# Patient Record
Sex: Male | Born: 1945 | Race: White | Hispanic: No | Marital: Married | Smoking: Former smoker
Health system: Southern US, Community
[De-identification: ages and names within clinical notes are randomized; demographics above are authoritative.]

## PROBLEM LIST (undated history)

## (undated) DIAGNOSIS — C801 Malignant (primary) neoplasm, unspecified: Secondary | ICD-10-CM

## (undated) DIAGNOSIS — E785 Hyperlipidemia, unspecified: Secondary | ICD-10-CM

## (undated) DIAGNOSIS — I10 Essential (primary) hypertension: Secondary | ICD-10-CM

## (undated) HISTORY — PX: BACK SURGERY: SHX140

## (undated) HISTORY — PX: KNEE SURGERY: SHX244

## (undated) HISTORY — PX: NECK SURGERY: SHX720

## (undated) HISTORY — PX: REPLACEMENT TOTAL KNEE: SUR1224

## (undated) HISTORY — PX: THYROIDECTOMY: SHX17

## (undated) HISTORY — PX: KIDNEY SURGERY: SHX687

## (undated) HISTORY — PX: TMJ ARTHROPLASTY: SHX1066

---

## 1998-04-20 ENCOUNTER — Encounter: Admission: RE | Admit: 1998-04-20 | Discharge: 1998-04-20 | Payer: Self-pay | Admitting: *Deleted

## 1998-08-07 ENCOUNTER — Encounter: Payer: Self-pay | Admitting: Neurosurgery

## 1998-08-09 ENCOUNTER — Inpatient Hospital Stay (HOSPITAL_COMMUNITY): Admission: RE | Admit: 1998-08-09 | Discharge: 1998-08-16 | Payer: Self-pay | Admitting: Neurosurgery

## 1998-08-09 ENCOUNTER — Encounter: Payer: Self-pay | Admitting: Neurosurgery

## 1998-08-13 ENCOUNTER — Encounter: Payer: Self-pay | Admitting: Neurosurgery

## 2005-11-15 ENCOUNTER — Inpatient Hospital Stay (HOSPITAL_COMMUNITY): Admission: EM | Admit: 2005-11-15 | Discharge: 2005-11-17 | Payer: Self-pay | Admitting: Emergency Medicine

## 2015-07-10 ENCOUNTER — Emergency Department (INDEPENDENT_AMBULATORY_CARE_PROVIDER_SITE_OTHER)
Admission: EM | Admit: 2015-07-10 | Discharge: 2015-07-10 | Disposition: A | Discharge status: Home / Self Care | Payer: Medicare Other | Source: Home / Self Care | Attending: Family Medicine | Admitting: Family Medicine

## 2015-07-10 ENCOUNTER — Encounter: Payer: Self-pay | Admitting: *Deleted

## 2015-07-10 ENCOUNTER — Emergency Department (INDEPENDENT_AMBULATORY_CARE_PROVIDER_SITE_OTHER): Payer: Medicare Other

## 2015-07-10 DIAGNOSIS — W010XXA Fall on same level from slipping, tripping and stumbling without subsequent striking against object, initial encounter: Secondary | ICD-10-CM

## 2015-07-10 DIAGNOSIS — R0781 Pleurodynia: Secondary | ICD-10-CM | POA: Diagnosis not present

## 2015-07-10 DIAGNOSIS — R0789 Other chest pain: Secondary | ICD-10-CM

## 2015-07-10 DIAGNOSIS — W1839XA Other fall on same level, initial encounter: Secondary | ICD-10-CM | POA: Diagnosis not present

## 2015-07-10 DIAGNOSIS — R079 Chest pain, unspecified: Secondary | ICD-10-CM

## 2015-07-10 HISTORY — DX: Hyperlipidemia, unspecified: E78.5

## 2015-07-10 HISTORY — DX: Essential (primary) hypertension: I10

## 2015-07-10 HISTORY — DX: Malignant (primary) neoplasm, unspecified: C80.1

## 2015-07-10 NOTE — Discharge Instructions (Signed)
Call to schedule appointment with your primary care provider for recheck of symptoms in 1-2 weeks if not improving, sooner if worsening including fever or productive cough.  You may continue to take your pain medications as prescribed. You may also find relief with alternating cool and warm compresses such as and ice pain and heating pad to your Right side.  You may also find some relief of pain by using a folded up jacket or small pillow, hold against your side when you need to cough or sneeze. See below for further instructions.

## 2015-07-10 NOTE — ED Provider Notes (Signed)
CSN: [id number]     Arrival date & time 07/10/15  1304 History   First MD Initiated Contact with Patient 07/10/15 1317     Chief Complaint  Patient presents with  . Rib Injury   (Consider location/radiation/quality/duration/timing/severity/associated sxs/prior Treatment) HPI  Pt is a 69yo male presenting to [facility] with of Right side chest wall pain after a trip and fall 3 days ago.  Pt also c/o Right shoulder and neck pain but reports several surgeries to his neck over the years.  Pt most concerned for Right sided chest pain. Pain is sharp with movement and deep breaths, worse with palpation.  Pain is 3/10 at this time.  Due to chronic back pain, pt is on dilaudid at home which has provided mild to moderate relief. He also reports mild intermittent non-productive cough but notes he had the cough before the fall, likely due to a BP medication he was one. He was switched to a different BP medication less than 1 week ago. Denies fever, chills, n/v/d. Denies hitting his head. He is not on blood thinners.  Pt states he goes to pain management and does not need any additional pain medication but wants to make sure he did not fracture a rib.  Past Medical History  Diagnosis Date  . Hypertension   . Hyperlipidemia   . Cancer     thyroid   Past Surgical History  Procedure Laterality Date  . Back surgery    . Neck surgery    . Replacement total knee Left   . Knee surgery Right   . Tmj arthroplasty    . Thyroidectomy    . Kidney surgery Right     partial removal   History reviewed. No pertinent family history. Social History  Substance Use Topics  . Smoking status: Former Research scientist (life sciences)  . Smokeless tobacco: None  . Alcohol Use: No    Review of Systems  Constitutional: Negative for fever and chills.  HENT: Negative for congestion, ear pain, sore throat, trouble swallowing and voice change.   Respiratory: Positive for cough. Negative for shortness of breath.   Cardiovascular: Positive for chest  pain ( Right side). Negative for palpitations.  Gastrointestinal: Negative for nausea, vomiting, abdominal pain and diarrhea.  Musculoskeletal: Positive for arthralgias ( Right shoulder, chronic) and neck pain ( chronic). Negative for myalgias and back pain.  Skin: Negative for rash.  All other systems reviewed and are negative.   Allergies  Review of patient's allergies indicates no known allergies.  Home Medications   Prior to Admission medications   Medication Sig Start Date End Date Taking? Authorizing Provider  buPROPion (WELLBUTRIN) 100 MG tablet Take 100 mg by mouth 2 (two) times daily.   Yes Historical Provider, MD  HYDROmorphone (DILAUDID) 8 MG tablet Take 8 mg by mouth every 4 (four) hours as needed for severe pain.   Yes Historical Provider, MD  methocarbamol (ROBAXIN) 750 MG tablet Take 750 mg by mouth 4 (four) times daily.   Yes Historical Provider, MD  metoprolol succinate (TOPROL-XL) 25 MG 24 hr tablet Take 25 mg by mouth daily.   Yes Historical Provider, MD   Meds Ordered and Administered this Visit  Medications - No data to display  BP 145/79 mmHg  Pulse 76  Temp(Src) 98.3 F (36.8 C) (Oral)  Resp 20  Ht 5\' 7"  (1.702 m)  Wt 245 lb (111.131 kg)  BMI 38.36 kg/m2  SpO2 97% No data found.   Physical Exam  Constitutional: He appears  well-developed and well-nourished.  HENT:  Head: Normocephalic and atraumatic.  Eyes: Conjunctivae are normal. No scleral icterus.  Neck: Normal range of motion.  Cardiovascular: Normal rate, regular rhythm and normal heart sounds.   Pulmonary/Chest: Effort normal and breath sounds normal. No respiratory distress. He has no wheezes. He has no rales. He exhibits tenderness.  Occasional cough. Visibly painful for pt to take deep breaths. Lungs: decreased breath sounds in lower lung fields. No wheeze or rhonchi. Right side chest wall tenderness to Ribs 4-6.  Abdominal: Soft. Bowel sounds are normal. He exhibits no distension and no  mass. There is no tenderness. There is no rebound and no guarding.  Musculoskeletal: Normal range of motion.  Neurological: He is alert.  Skin: Skin is warm and dry.  Nursing note and vitals reviewed.   ED Course  Procedures (including critical care time)  Labs Review Labs Reviewed - No data to display  Imaging Review Dg Ribs Unilateral W/chest Right  07/10/2015   CLINICAL DATA:  Status post fall a few days ago with right rib pain.  EXAM: RIGHT RIBS AND CHEST - 3+ VIEW  COMPARISON:  November 15, 2005  FINDINGS: No fracture or other bone lesions are seen involving the ribs. There is no evidence of pneumothorax or pleural effusion. Both lungs are clear. Heart size and mediastinal contours are within normal limits.  IMPRESSION: Negative.   Electronically Signed   By: Abelardo Diesel M.D.   On: 07/10/2015 14:02      MDM   1. Fall from slip, trip, or stumble, initial encounter   2. Right-sided chest wall pain    Pt c/o Right side chest pain after a trip and fall. He did not hit his head and is not on any blood thinners.  Lungs: clear but painful deep inspiration. Pt is afebrile. CXR: negative for fracture, pneumothorax or pleural effusion.   Pt already on chronic pain medication, dilaudid and robaxin for chronic back pain, followed by pain management. Encouraged to keep taking as prescribed. Pt's wife is a former respiratory therapist, encouraged to use incentive spirometer at home to help prevent pneumonia. F/u with PCP in 1-2 weeks, sooner if worsening. Patient verbalized understanding and agreement with treatment plan.     Noland Fordyce, PA-C 07/10/15 1418

## 2015-07-10 NOTE — ED Notes (Signed)
Pt c/o RT rib pain x 3 days post fall. Worse with coughing and deep breathing.

## 2015-09-04 DIAGNOSIS — N289 Disorder of kidney and ureter, unspecified: Secondary | ICD-10-CM | POA: Diagnosis not present

## 2015-09-04 DIAGNOSIS — Z9660 Presence of unspecified orthopedic joint implant: Secondary | ICD-10-CM | POA: Diagnosis not present

## 2015-09-04 DIAGNOSIS — T50901A Poisoning by unspecified drugs, medicaments and biological substances, accidental (unintentional), initial encounter: Secondary | ICD-10-CM | POA: Diagnosis not present

## 2015-09-04 DIAGNOSIS — I10 Essential (primary) hypertension: Secondary | ICD-10-CM | POA: Diagnosis not present

## 2015-09-04 DIAGNOSIS — Z79899 Other long term (current) drug therapy: Secondary | ICD-10-CM | POA: Diagnosis not present

## 2015-09-04 DIAGNOSIS — T465X1A Poisoning by other antihypertensive drugs, accidental (unintentional), initial encounter: Secondary | ICD-10-CM | POA: Diagnosis not present

## 2015-09-04 DIAGNOSIS — H540 Blindness, both eyes: Secondary | ICD-10-CM | POA: Diagnosis not present

## 2015-09-04 DIAGNOSIS — Z9889 Other specified postprocedural states: Secondary | ICD-10-CM | POA: Diagnosis not present

## 2015-10-19 DIAGNOSIS — Z79899 Other long term (current) drug therapy: Secondary | ICD-10-CM | POA: Diagnosis not present

## 2015-10-19 DIAGNOSIS — I10 Essential (primary) hypertension: Secondary | ICD-10-CM | POA: Diagnosis not present

## 2015-10-19 DIAGNOSIS — R911 Solitary pulmonary nodule: Secondary | ICD-10-CM | POA: Diagnosis not present

## 2015-10-19 DIAGNOSIS — R042 Hemoptysis: Secondary | ICD-10-CM | POA: Diagnosis not present

## 2015-10-19 DIAGNOSIS — J189 Pneumonia, unspecified organism: Secondary | ICD-10-CM | POA: Diagnosis not present

## 2015-10-19 DIAGNOSIS — J168 Pneumonia due to other specified infectious organisms: Secondary | ICD-10-CM | POA: Diagnosis not present

## 2015-10-19 DIAGNOSIS — N289 Disorder of kidney and ureter, unspecified: Secondary | ICD-10-CM | POA: Diagnosis not present

## 2015-10-19 DIAGNOSIS — H409 Unspecified glaucoma: Secondary | ICD-10-CM | POA: Diagnosis not present

## 2016-03-31 DIAGNOSIS — M961 Postlaminectomy syndrome, not elsewhere classified: Secondary | ICD-10-CM | POA: Diagnosis not present

## 2016-03-31 DIAGNOSIS — G894 Chronic pain syndrome: Secondary | ICD-10-CM | POA: Diagnosis not present

## 2016-06-24 DIAGNOSIS — M542 Cervicalgia: Secondary | ICD-10-CM | POA: Diagnosis not present

## 2016-06-24 DIAGNOSIS — Z5181 Encounter for therapeutic drug level monitoring: Secondary | ICD-10-CM | POA: Diagnosis not present

## 2016-06-24 DIAGNOSIS — G894 Chronic pain syndrome: Secondary | ICD-10-CM | POA: Diagnosis not present

## 2016-06-24 DIAGNOSIS — M961 Postlaminectomy syndrome, not elsewhere classified: Secondary | ICD-10-CM | POA: Diagnosis not present

## 2016-06-24 DIAGNOSIS — Z79899 Other long term (current) drug therapy: Secondary | ICD-10-CM | POA: Diagnosis not present

## 2016-06-26 DIAGNOSIS — M47812 Spondylosis without myelopathy or radiculopathy, cervical region: Secondary | ICD-10-CM | POA: Diagnosis not present

## 2016-06-26 DIAGNOSIS — M542 Cervicalgia: Secondary | ICD-10-CM | POA: Diagnosis not present

## 2016-07-04 DIAGNOSIS — F329 Major depressive disorder, single episode, unspecified: Secondary | ICD-10-CM | POA: Diagnosis not present

## 2016-07-04 DIAGNOSIS — Z87891 Personal history of nicotine dependence: Secondary | ICD-10-CM | POA: Diagnosis not present

## 2016-07-04 DIAGNOSIS — E7800 Pure hypercholesterolemia, unspecified: Secondary | ICD-10-CM | POA: Diagnosis not present

## 2016-07-04 DIAGNOSIS — E039 Hypothyroidism, unspecified: Secondary | ICD-10-CM | POA: Diagnosis not present

## 2016-07-04 DIAGNOSIS — M47812 Spondylosis without myelopathy or radiculopathy, cervical region: Secondary | ICD-10-CM | POA: Diagnosis not present

## 2016-07-04 DIAGNOSIS — I10 Essential (primary) hypertension: Secondary | ICD-10-CM | POA: Diagnosis not present

## 2016-07-04 DIAGNOSIS — M1990 Unspecified osteoarthritis, unspecified site: Secondary | ICD-10-CM | POA: Diagnosis not present

## 2016-07-04 DIAGNOSIS — Z79899 Other long term (current) drug therapy: Secondary | ICD-10-CM | POA: Diagnosis not present

## 2016-07-04 DIAGNOSIS — G4730 Sleep apnea, unspecified: Secondary | ICD-10-CM | POA: Diagnosis not present

## 2016-07-18 DIAGNOSIS — M47812 Spondylosis without myelopathy or radiculopathy, cervical region: Secondary | ICD-10-CM | POA: Diagnosis not present

## 2016-07-18 DIAGNOSIS — M542 Cervicalgia: Secondary | ICD-10-CM | POA: Diagnosis not present

## 2016-07-18 DIAGNOSIS — G894 Chronic pain syndrome: Secondary | ICD-10-CM | POA: Diagnosis not present

## 2016-07-18 DIAGNOSIS — M961 Postlaminectomy syndrome, not elsewhere classified: Secondary | ICD-10-CM | POA: Diagnosis not present

## 2016-08-22 DIAGNOSIS — M961 Postlaminectomy syndrome, not elsewhere classified: Secondary | ICD-10-CM | POA: Diagnosis not present

## 2016-08-22 DIAGNOSIS — I10 Essential (primary) hypertension: Secondary | ICD-10-CM | POA: Diagnosis not present

## 2016-08-22 DIAGNOSIS — Z87891 Personal history of nicotine dependence: Secondary | ICD-10-CM | POA: Diagnosis not present

## 2016-08-22 DIAGNOSIS — G894 Chronic pain syndrome: Secondary | ICD-10-CM | POA: Diagnosis not present

## 2016-08-22 DIAGNOSIS — Z91041 Radiographic dye allergy status: Secondary | ICD-10-CM | POA: Diagnosis not present

## 2016-08-22 DIAGNOSIS — M1990 Unspecified osteoarthritis, unspecified site: Secondary | ICD-10-CM | POA: Diagnosis not present

## 2016-08-22 DIAGNOSIS — M47812 Spondylosis without myelopathy or radiculopathy, cervical region: Secondary | ICD-10-CM | POA: Diagnosis not present

## 2016-08-22 DIAGNOSIS — G4733 Obstructive sleep apnea (adult) (pediatric): Secondary | ICD-10-CM | POA: Diagnosis not present

## 2016-09-30 DIAGNOSIS — M542 Cervicalgia: Secondary | ICD-10-CM | POA: Diagnosis not present

## 2016-09-30 DIAGNOSIS — M47812 Spondylosis without myelopathy or radiculopathy, cervical region: Secondary | ICD-10-CM | POA: Diagnosis not present

## 2016-09-30 DIAGNOSIS — Z79899 Other long term (current) drug therapy: Secondary | ICD-10-CM | POA: Diagnosis not present

## 2016-09-30 DIAGNOSIS — M5030 Other cervical disc degeneration, unspecified cervical region: Secondary | ICD-10-CM | POA: Diagnosis not present

## 2016-09-30 DIAGNOSIS — M961 Postlaminectomy syndrome, not elsewhere classified: Secondary | ICD-10-CM | POA: Diagnosis not present

## 2016-09-30 DIAGNOSIS — Z5181 Encounter for therapeutic drug level monitoring: Secondary | ICD-10-CM | POA: Diagnosis not present

## 2016-10-10 DIAGNOSIS — I10 Essential (primary) hypertension: Secondary | ICD-10-CM | POA: Diagnosis not present

## 2016-10-10 DIAGNOSIS — Z91041 Radiographic dye allergy status: Secondary | ICD-10-CM | POA: Diagnosis not present

## 2016-10-10 DIAGNOSIS — G894 Chronic pain syndrome: Secondary | ICD-10-CM | POA: Diagnosis not present

## 2016-10-10 DIAGNOSIS — Z87891 Personal history of nicotine dependence: Secondary | ICD-10-CM | POA: Diagnosis not present

## 2016-10-10 DIAGNOSIS — M47812 Spondylosis without myelopathy or radiculopathy, cervical region: Secondary | ICD-10-CM | POA: Diagnosis not present

## 2016-10-10 DIAGNOSIS — G4730 Sleep apnea, unspecified: Secondary | ICD-10-CM | POA: Diagnosis not present

## 2016-11-17 DIAGNOSIS — M961 Postlaminectomy syndrome, not elsewhere classified: Secondary | ICD-10-CM | POA: Diagnosis not present

## 2016-11-17 DIAGNOSIS — M47812 Spondylosis without myelopathy or radiculopathy, cervical region: Secondary | ICD-10-CM | POA: Diagnosis not present

## 2016-11-17 DIAGNOSIS — Z5181 Encounter for therapeutic drug level monitoring: Secondary | ICD-10-CM | POA: Diagnosis not present

## 2016-11-17 DIAGNOSIS — Z79899 Other long term (current) drug therapy: Secondary | ICD-10-CM | POA: Diagnosis not present

## 2016-11-17 DIAGNOSIS — M549 Dorsalgia, unspecified: Secondary | ICD-10-CM | POA: Diagnosis not present

## 2016-11-17 DIAGNOSIS — M542 Cervicalgia: Secondary | ICD-10-CM | POA: Diagnosis not present

## 2016-12-01 DIAGNOSIS — G894 Chronic pain syndrome: Secondary | ICD-10-CM | POA: Diagnosis not present

## 2016-12-01 DIAGNOSIS — M961 Postlaminectomy syndrome, not elsewhere classified: Secondary | ICD-10-CM | POA: Diagnosis not present

## 2016-12-01 DIAGNOSIS — M47812 Spondylosis without myelopathy or radiculopathy, cervical region: Secondary | ICD-10-CM | POA: Diagnosis not present

## 2016-12-01 DIAGNOSIS — M549 Dorsalgia, unspecified: Secondary | ICD-10-CM | POA: Diagnosis not present

## 2017-01-27 DIAGNOSIS — M549 Dorsalgia, unspecified: Secondary | ICD-10-CM | POA: Diagnosis not present

## 2017-01-27 DIAGNOSIS — M542 Cervicalgia: Secondary | ICD-10-CM | POA: Diagnosis not present

## 2017-01-27 DIAGNOSIS — G894 Chronic pain syndrome: Secondary | ICD-10-CM | POA: Diagnosis not present

## 2017-01-27 DIAGNOSIS — M961 Postlaminectomy syndrome, not elsewhere classified: Secondary | ICD-10-CM | POA: Diagnosis not present

## 2017-04-23 DIAGNOSIS — M961 Postlaminectomy syndrome, not elsewhere classified: Secondary | ICD-10-CM | POA: Diagnosis not present

## 2017-04-23 DIAGNOSIS — M47812 Spondylosis without myelopathy or radiculopathy, cervical region: Secondary | ICD-10-CM | POA: Diagnosis not present

## 2017-04-23 DIAGNOSIS — G894 Chronic pain syndrome: Secondary | ICD-10-CM | POA: Diagnosis not present

## 2017-04-23 DIAGNOSIS — M549 Dorsalgia, unspecified: Secondary | ICD-10-CM | POA: Diagnosis not present

## 2017-06-21 DIAGNOSIS — L259 Unspecified contact dermatitis, unspecified cause: Secondary | ICD-10-CM | POA: Diagnosis not present

## 2017-07-07 DIAGNOSIS — Z79899 Other long term (current) drug therapy: Secondary | ICD-10-CM | POA: Diagnosis not present

## 2017-07-07 DIAGNOSIS — Z5181 Encounter for therapeutic drug level monitoring: Secondary | ICD-10-CM | POA: Diagnosis not present

## 2017-07-07 DIAGNOSIS — M961 Postlaminectomy syndrome, not elsewhere classified: Secondary | ICD-10-CM | POA: Diagnosis not present

## 2017-07-07 DIAGNOSIS — M549 Dorsalgia, unspecified: Secondary | ICD-10-CM | POA: Diagnosis not present

## 2017-07-07 DIAGNOSIS — G894 Chronic pain syndrome: Secondary | ICD-10-CM | POA: Diagnosis not present

## 2017-07-07 DIAGNOSIS — M5030 Other cervical disc degeneration, unspecified cervical region: Secondary | ICD-10-CM | POA: Diagnosis not present

## 2017-07-28 DIAGNOSIS — Z23 Encounter for immunization: Secondary | ICD-10-CM | POA: Diagnosis not present

## 2017-09-03 IMAGING — CR DG RIBS W/ CHEST 3+V*R*
3 series · 3 of 3 positions shown · non-contrast
Comparison: November 15, 2005

CLINICAL DATA: Status post fall a few days ago with right rib pain.

EXAM:
RIGHT RIBS AND CHEST - 3+ VIEW

[chest pa]
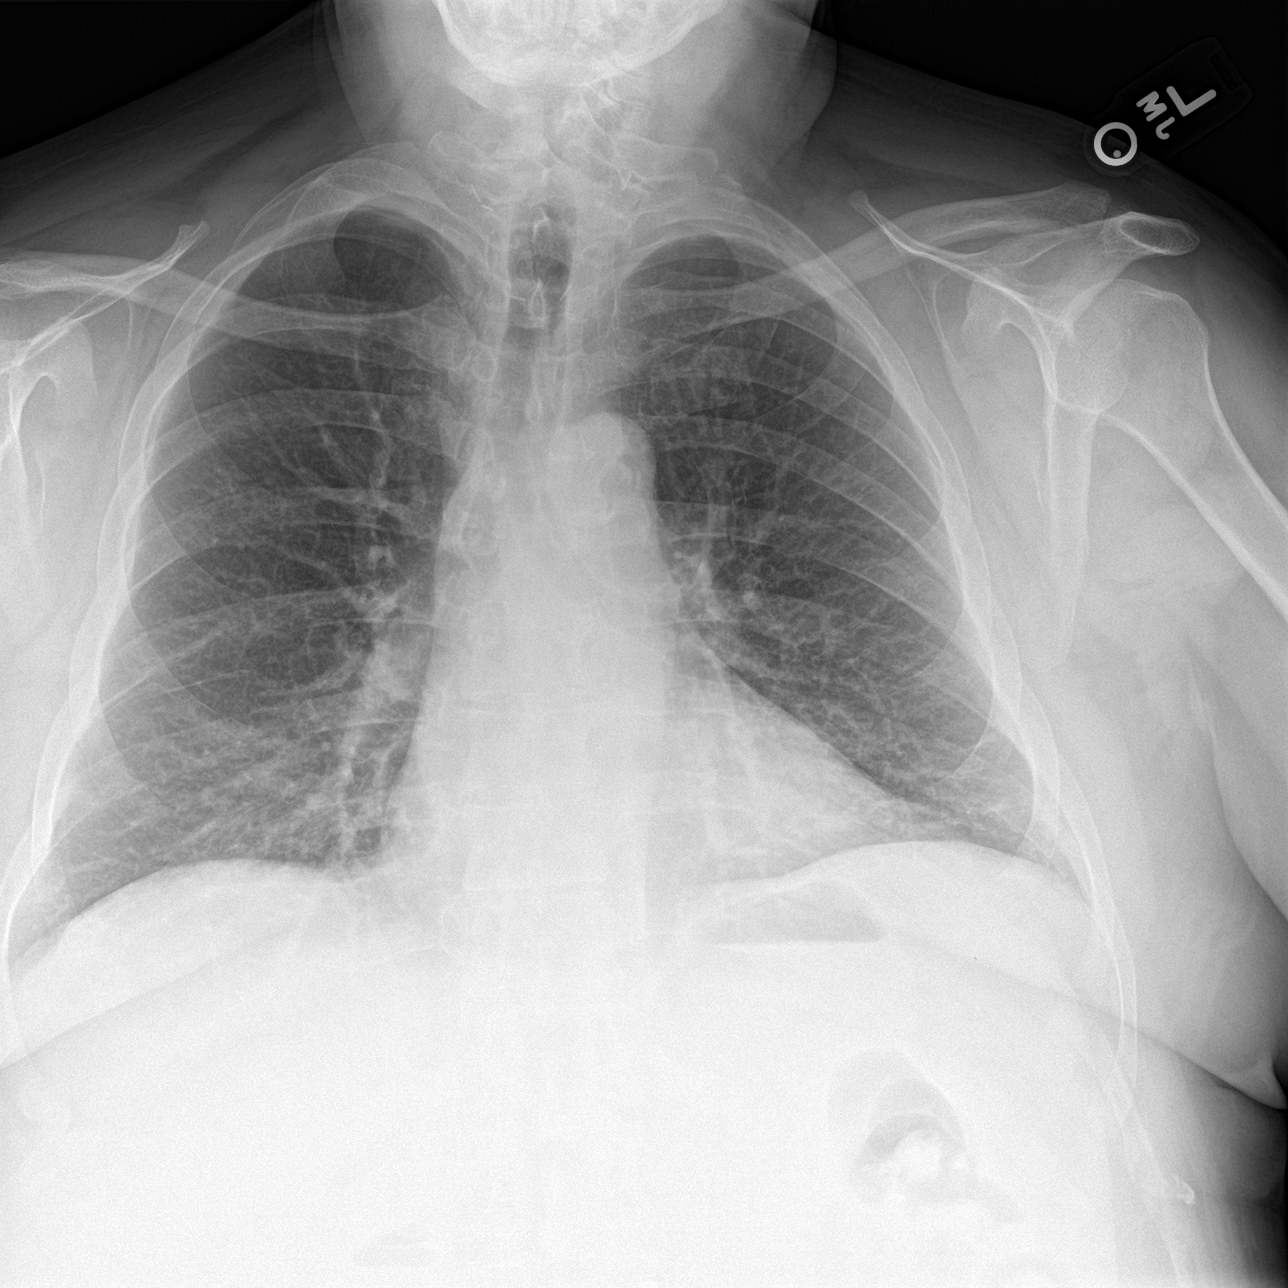

[rib pa]
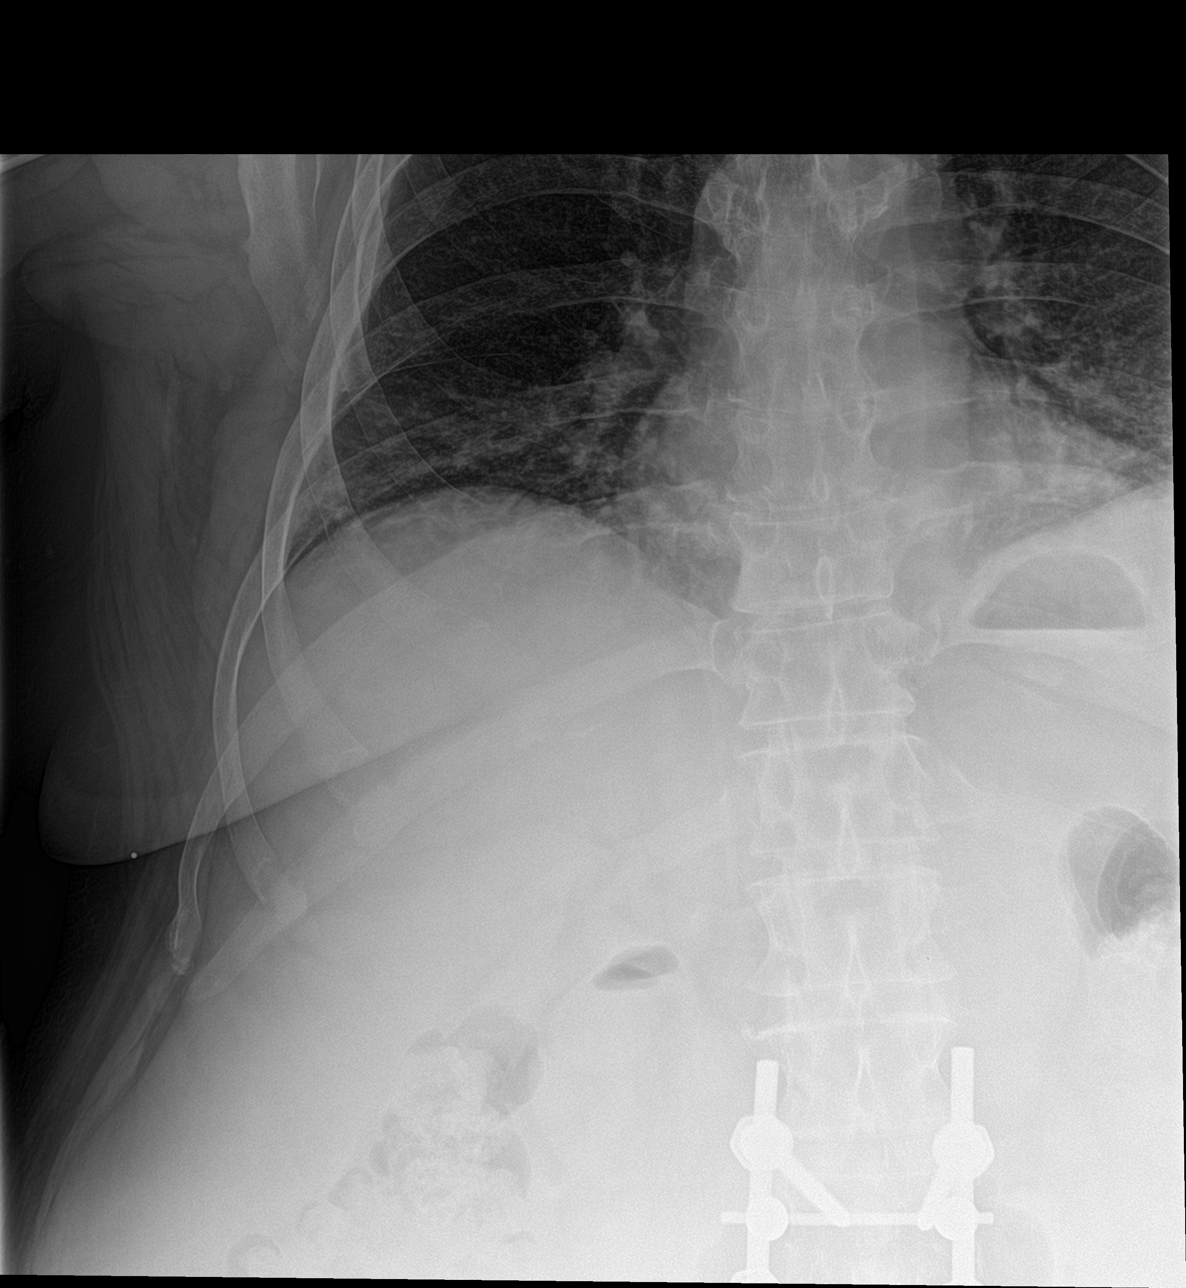

[rib pa obl]
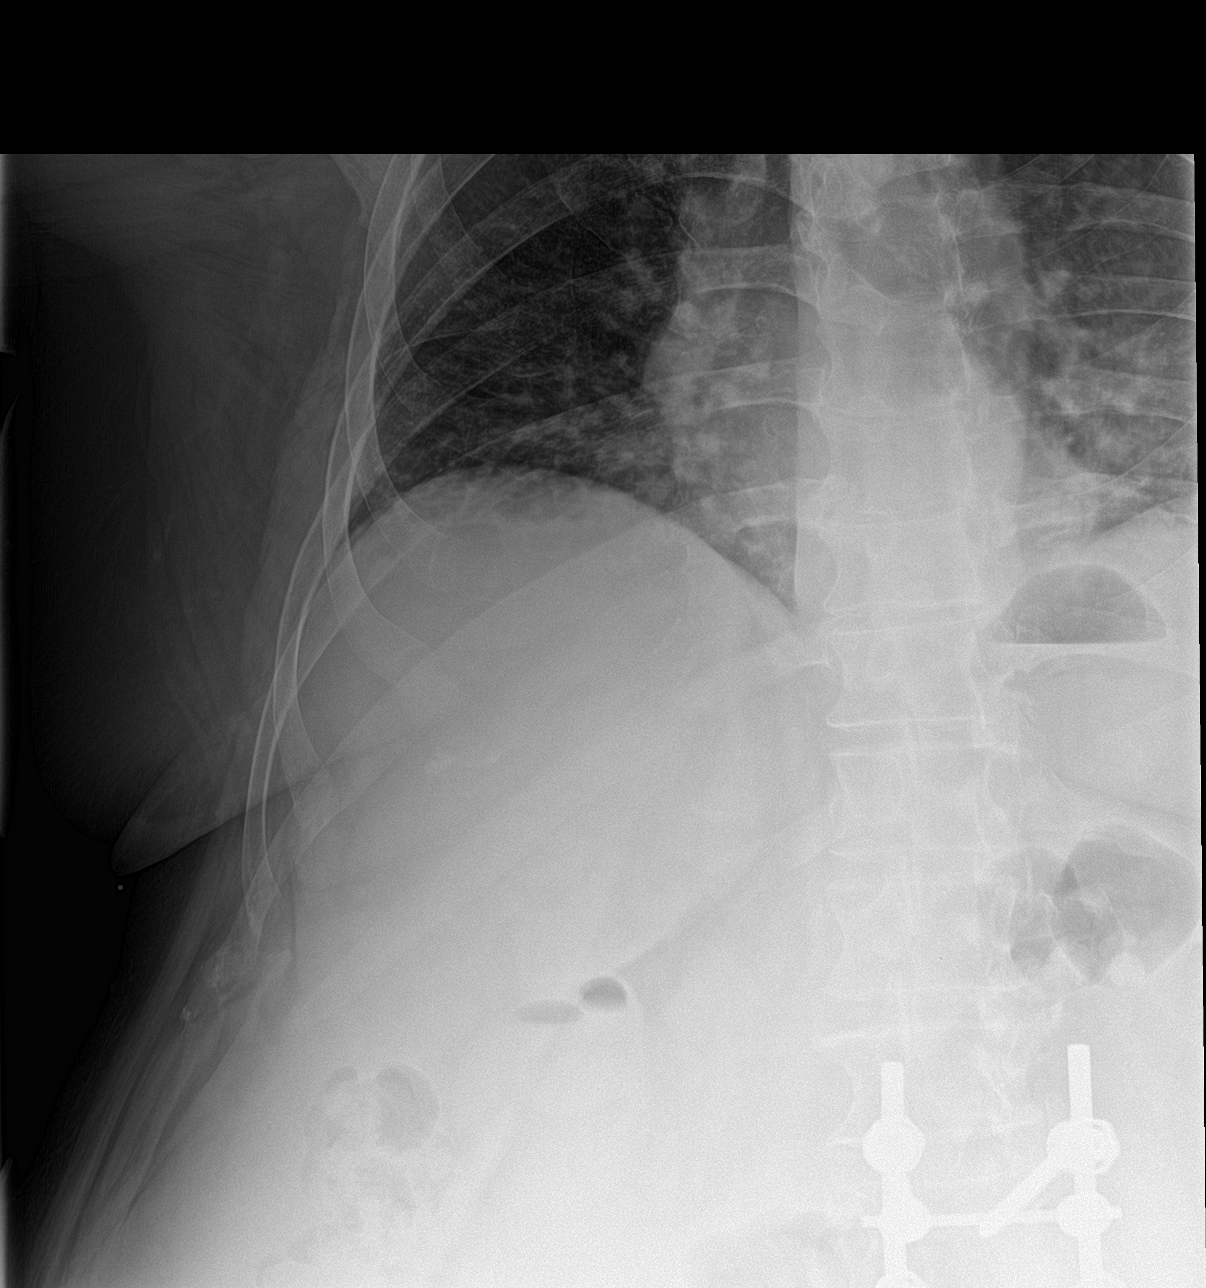

[3 of 3 positions shown; findings below may reference images not displayed]

FINDINGS: No fracture or other bone lesions are seen involving the ribs. There
is no evidence of pneumothorax or pleural effusion. Both lungs are
clear. Heart size and mediastinal contours are within normal limits.
IMPRESSION: Negative.

## 2017-10-06 DIAGNOSIS — M5412 Radiculopathy, cervical region: Secondary | ICD-10-CM | POA: Diagnosis not present

## 2017-10-06 DIAGNOSIS — M545 Low back pain: Secondary | ICD-10-CM | POA: Diagnosis not present

## 2017-10-06 DIAGNOSIS — M961 Postlaminectomy syndrome, not elsewhere classified: Secondary | ICD-10-CM | POA: Diagnosis not present

## 2017-10-06 DIAGNOSIS — G894 Chronic pain syndrome: Secondary | ICD-10-CM | POA: Diagnosis not present

## 2017-12-22 DIAGNOSIS — M545 Low back pain: Secondary | ICD-10-CM | POA: Diagnosis not present

## 2017-12-22 DIAGNOSIS — M961 Postlaminectomy syndrome, not elsewhere classified: Secondary | ICD-10-CM | POA: Diagnosis not present

## 2017-12-22 DIAGNOSIS — G894 Chronic pain syndrome: Secondary | ICD-10-CM | POA: Diagnosis not present

## 2017-12-22 DIAGNOSIS — M5412 Radiculopathy, cervical region: Secondary | ICD-10-CM | POA: Diagnosis not present

## 2018-03-16 DIAGNOSIS — Z79899 Other long term (current) drug therapy: Secondary | ICD-10-CM | POA: Diagnosis not present

## 2018-03-16 DIAGNOSIS — M5412 Radiculopathy, cervical region: Secondary | ICD-10-CM | POA: Diagnosis not present

## 2018-03-16 DIAGNOSIS — M961 Postlaminectomy syndrome, not elsewhere classified: Secondary | ICD-10-CM | POA: Diagnosis not present

## 2018-03-16 DIAGNOSIS — M545 Low back pain: Secondary | ICD-10-CM | POA: Diagnosis not present

## 2018-03-16 DIAGNOSIS — Z5181 Encounter for therapeutic drug level monitoring: Secondary | ICD-10-CM | POA: Diagnosis not present

## 2018-03-16 DIAGNOSIS — G894 Chronic pain syndrome: Secondary | ICD-10-CM | POA: Diagnosis not present

## 2018-06-23 DIAGNOSIS — M545 Low back pain: Secondary | ICD-10-CM | POA: Diagnosis not present

## 2018-06-23 DIAGNOSIS — M5412 Radiculopathy, cervical region: Secondary | ICD-10-CM | POA: Diagnosis not present

## 2018-06-23 DIAGNOSIS — M961 Postlaminectomy syndrome, not elsewhere classified: Secondary | ICD-10-CM | POA: Diagnosis not present

## 2018-06-23 DIAGNOSIS — G894 Chronic pain syndrome: Secondary | ICD-10-CM | POA: Diagnosis not present

## 2018-06-29 DIAGNOSIS — M5011 Cervical disc disorder with radiculopathy,  high cervical region: Secondary | ICD-10-CM | POA: Diagnosis not present

## 2018-06-29 DIAGNOSIS — M549 Dorsalgia, unspecified: Secondary | ICD-10-CM | POA: Diagnosis not present

## 2018-06-29 DIAGNOSIS — M47896 Other spondylosis, lumbar region: Secondary | ICD-10-CM | POA: Diagnosis not present

## 2018-06-29 DIAGNOSIS — M50121 Cervical disc disorder at C4-C5 level with radiculopathy: Secondary | ICD-10-CM | POA: Diagnosis not present

## 2018-06-29 DIAGNOSIS — M50122 Cervical disc disorder at C5-C6 level with radiculopathy: Secondary | ICD-10-CM | POA: Diagnosis not present

## 2018-06-29 DIAGNOSIS — M5136 Other intervertebral disc degeneration, lumbar region: Secondary | ICD-10-CM | POA: Diagnosis not present

## 2018-06-29 DIAGNOSIS — M5126 Other intervertebral disc displacement, lumbar region: Secondary | ICD-10-CM | POA: Diagnosis not present

## 2018-06-29 DIAGNOSIS — Q7649 Other congenital malformations of spine, not associated with scoliosis: Secondary | ICD-10-CM | POA: Diagnosis not present

## 2018-06-29 DIAGNOSIS — M50123 Cervical disc disorder at C6-C7 level with radiculopathy: Secondary | ICD-10-CM | POA: Diagnosis not present

## 2018-06-29 DIAGNOSIS — M5124 Other intervertebral disc displacement, thoracic region: Secondary | ICD-10-CM | POA: Diagnosis not present

## 2018-06-29 DIAGNOSIS — M545 Low back pain: Secondary | ICD-10-CM | POA: Diagnosis not present

## 2018-06-29 DIAGNOSIS — M48061 Spinal stenosis, lumbar region without neurogenic claudication: Secondary | ICD-10-CM | POA: Diagnosis not present

## 2018-06-29 DIAGNOSIS — M5412 Radiculopathy, cervical region: Secondary | ICD-10-CM | POA: Diagnosis not present

## 2018-06-29 DIAGNOSIS — Q765 Cervical rib: Secondary | ICD-10-CM | POA: Diagnosis not present

## 2018-07-13 DIAGNOSIS — F320 Major depressive disorder, single episode, mild: Secondary | ICD-10-CM | POA: Diagnosis not present

## 2018-07-13 DIAGNOSIS — F4310 Post-traumatic stress disorder, unspecified: Secondary | ICD-10-CM | POA: Diagnosis not present

## 2018-07-13 DIAGNOSIS — G894 Chronic pain syndrome: Secondary | ICD-10-CM | POA: Diagnosis not present

## 2018-09-01 DIAGNOSIS — M4802 Spinal stenosis, cervical region: Secondary | ICD-10-CM | POA: Diagnosis not present

## 2018-09-01 DIAGNOSIS — G894 Chronic pain syndrome: Secondary | ICD-10-CM | POA: Diagnosis not present

## 2018-09-01 DIAGNOSIS — G039 Meningitis, unspecified: Secondary | ICD-10-CM | POA: Diagnosis not present

## 2018-09-01 DIAGNOSIS — M961 Postlaminectomy syndrome, not elsewhere classified: Secondary | ICD-10-CM | POA: Diagnosis not present

## 2019-12-04 MED ORDER — ALBUTEROL SULFATE HFA 108 (90 BASE) MCG/ACT IN AERS
2.00 | INHALATION_SPRAY | RESPIRATORY_TRACT | Status: DC
Start: ? — End: 2019-12-04

## 2019-12-04 MED ORDER — HYDRALAZINE HCL 20 MG/ML IJ SOLN
10.00 | INTRAMUSCULAR | Status: DC
Start: ? — End: 2019-12-04

## 2019-12-04 MED ORDER — GENERIC EXTERNAL MEDICATION
220.00 | Status: DC
Start: 2019-12-04 — End: 2019-12-04

## 2019-12-04 MED ORDER — POLYETHYLENE GLYCOL 3350 17 GM/SCOOP PO POWD
17.00 | ORAL | Status: DC
Start: ? — End: 2019-12-04

## 2019-12-04 MED ORDER — TROPICAL LIQUID NUTRITION PO LIQD
5.00 | ORAL | Status: DC
Start: 2019-12-04 — End: 2019-12-04

## 2019-12-04 MED ORDER — CHOLECALCIFEROL 10 MCG/ML (400 UNIT/ML) PO LIQD
2000.00 | ORAL | Status: DC
Start: 2019-12-04 — End: 2019-12-04

## 2019-12-04 MED ORDER — GENERIC EXTERNAL MEDICATION
Status: DC
Start: ? — End: 2019-12-04

## 2019-12-04 MED ORDER — ACETAMINOPHEN 160 MG/5ML PO SUSP
650.00 | ORAL | Status: DC
Start: ? — End: 2019-12-04

## 2019-12-04 MED ORDER — TRAZODONE HCL 50 MG PO TABS
50.00 | ORAL_TABLET | ORAL | Status: DC
Start: ? — End: 2019-12-04

## 2019-12-04 MED ORDER — SODIUM CHLORIDE 0.9 % IV SOLN
10.00 | INTRAVENOUS | Status: DC
Start: ? — End: 2019-12-04

## 2019-12-04 MED ORDER — DOCUSATE SODIUM 150 MG/15ML PO LIQD
100.00 | ORAL | Status: DC
Start: 2019-12-04 — End: 2019-12-04

## 2019-12-04 MED ORDER — GENERIC EXTERNAL MEDICATION
3.38 | Status: DC
Start: 2019-12-04 — End: 2019-12-04

## 2019-12-04 MED ORDER — FENTANYL CITRATE 2500 MCG/50ML IV SOLN
1.00 | INTRAVENOUS | Status: DC
Start: ? — End: 2019-12-04

## 2019-12-04 MED ORDER — ENOXAPARIN SODIUM 40 MG/0.4ML ~~LOC~~ SOLN
40.00 | SUBCUTANEOUS | Status: DC
Start: 2019-12-04 — End: 2019-12-04

## 2019-12-04 MED ORDER — GENERIC EXTERNAL MEDICATION
0.10 | Status: DC
Start: ? — End: 2019-12-04

## 2019-12-04 MED ORDER — CARBOXYMETHYLCELLULOSE SODIUM 1 % OP GEL
1.00 | OPHTHALMIC | Status: DC
Start: ? — End: 2019-12-04

## 2019-12-04 MED ORDER — LABETALOL HCL 5 MG/ML IV SOLN
20.00 | INTRAVENOUS | Status: DC
Start: ? — End: 2019-12-04

## 2019-12-04 MED ORDER — PANTOPRAZOLE SODIUM 40 MG IV SOLR
40.00 | INTRAVENOUS | Status: DC
Start: 2019-12-04 — End: 2019-12-04

## 2019-12-04 MED ORDER — PROMETHAZINE HCL 25 MG/ML IJ SOLN
12.50 | INTRAMUSCULAR | Status: DC
Start: ? — End: 2019-12-04

## 2019-12-04 MED ORDER — METOPROLOL TARTRATE 5 MG/5ML IV SOLN
5.00 | INTRAVENOUS | Status: DC
Start: 2019-12-04 — End: 2019-12-04

## 2019-12-04 MED ORDER — PROPOFOL 200 MG/20ML IV EMUL
1.00 | INTRAVENOUS | Status: DC
Start: ? — End: 2019-12-04

## 2019-12-04 MED ORDER — HYDROMORPHONE HCL 2 MG PO TABS
8.00 | ORAL_TABLET | ORAL | Status: DC
Start: ? — End: 2019-12-04

## 2019-12-04 MED ORDER — LEVOTHYROXINE SODIUM 137 MCG PO TABS
137.00 | ORAL_TABLET | ORAL | Status: DC
Start: 2019-12-04 — End: 2019-12-04

## 2020-01-19 DEATH — deceased
